# Patient Record
Sex: Male | Born: 1935 | Race: White | Hispanic: No | Marital: Married | Smoking: Never smoker
Health system: Southern US, Community
[De-identification: ages and names within clinical notes are randomized; demographics above are authoritative.]

## PROBLEM LIST (undated history)

## (undated) DIAGNOSIS — F329 Major depressive disorder, single episode, unspecified: Secondary | ICD-10-CM

## (undated) DIAGNOSIS — E119 Type 2 diabetes mellitus without complications: Secondary | ICD-10-CM

## (undated) DIAGNOSIS — N400 Enlarged prostate without lower urinary tract symptoms: Secondary | ICD-10-CM

## (undated) DIAGNOSIS — N184 Chronic kidney disease, stage 4 (severe): Secondary | ICD-10-CM

## (undated) DIAGNOSIS — F32A Depression, unspecified: Secondary | ICD-10-CM

## (undated) DIAGNOSIS — C4440 Unspecified malignant neoplasm of skin of scalp and neck: Secondary | ICD-10-CM

---

## 2006-01-11 ENCOUNTER — Ambulatory Visit: Payer: Self-pay | Admitting: Chiropractic Medicine

## 2009-04-30 ENCOUNTER — Ambulatory Visit: Payer: Self-pay | Admitting: Surgery

## 2017-05-18 DIAGNOSIS — C4440 Unspecified malignant neoplasm of skin of scalp and neck: Secondary | ICD-10-CM

## 2017-05-18 HISTORY — DX: Unspecified malignant neoplasm of skin of scalp and neck: C44.40

## 2018-03-18 ENCOUNTER — Observation Stay
Admission: EM | Admit: 2018-03-18 | Discharge: 2018-03-19 | Disposition: A | Discharge status: Home / Self Care | Payer: Medicare HMO | Attending: Internal Medicine | Admitting: Internal Medicine

## 2018-03-18 ENCOUNTER — Other Ambulatory Visit: Payer: Self-pay

## 2018-03-18 ENCOUNTER — Emergency Department: Payer: Medicare HMO

## 2018-03-18 DIAGNOSIS — N184 Chronic kidney disease, stage 4 (severe): Secondary | ICD-10-CM | POA: Diagnosis present

## 2018-03-18 DIAGNOSIS — F329 Major depressive disorder, single episode, unspecified: Secondary | ICD-10-CM | POA: Insufficient documentation

## 2018-03-18 DIAGNOSIS — E1122 Type 2 diabetes mellitus with diabetic chronic kidney disease: Secondary | ICD-10-CM | POA: Insufficient documentation

## 2018-03-18 DIAGNOSIS — E119 Type 2 diabetes mellitus without complications: Secondary | ICD-10-CM

## 2018-03-18 DIAGNOSIS — Z85828 Personal history of other malignant neoplasm of skin: Secondary | ICD-10-CM | POA: Diagnosis not present

## 2018-03-18 DIAGNOSIS — N3001 Acute cystitis with hematuria: Secondary | ICD-10-CM | POA: Diagnosis present

## 2018-03-18 DIAGNOSIS — N401 Enlarged prostate with lower urinary tract symptoms: Secondary | ICD-10-CM | POA: Insufficient documentation

## 2018-03-18 DIAGNOSIS — T839XXA Unspecified complication of genitourinary prosthetic device, implant and graft, initial encounter: Secondary | ICD-10-CM

## 2018-03-18 DIAGNOSIS — Z8249 Family history of ischemic heart disease and other diseases of the circulatory system: Secondary | ICD-10-CM | POA: Insufficient documentation

## 2018-03-18 DIAGNOSIS — K449 Diaphragmatic hernia without obstruction or gangrene: Secondary | ICD-10-CM | POA: Diagnosis not present

## 2018-03-18 DIAGNOSIS — R338 Other retention of urine: Secondary | ICD-10-CM | POA: Diagnosis present

## 2018-03-18 DIAGNOSIS — Z882 Allergy status to sulfonamides status: Secondary | ICD-10-CM | POA: Diagnosis not present

## 2018-03-18 DIAGNOSIS — Z79899 Other long term (current) drug therapy: Secondary | ICD-10-CM | POA: Insufficient documentation

## 2018-03-18 DIAGNOSIS — Z466 Encounter for fitting and adjustment of urinary device: Secondary | ICD-10-CM | POA: Diagnosis not present

## 2018-03-18 DIAGNOSIS — N289 Disorder of kidney and ureter, unspecified: Secondary | ICD-10-CM | POA: Diagnosis present

## 2018-03-18 DIAGNOSIS — I10 Essential (primary) hypertension: Secondary | ICD-10-CM | POA: Diagnosis present

## 2018-03-18 DIAGNOSIS — Z66 Do not resuscitate: Secondary | ICD-10-CM | POA: Diagnosis not present

## 2018-03-18 DIAGNOSIS — I700 Atherosclerosis of aorta: Secondary | ICD-10-CM | POA: Insufficient documentation

## 2018-03-18 DIAGNOSIS — Z7984 Long term (current) use of oral hypoglycemic drugs: Secondary | ICD-10-CM | POA: Insufficient documentation

## 2018-03-18 DIAGNOSIS — N138 Other obstructive and reflux uropathy: Secondary | ICD-10-CM | POA: Diagnosis present

## 2018-03-18 DIAGNOSIS — K5730 Diverticulosis of large intestine without perforation or abscess without bleeding: Secondary | ICD-10-CM | POA: Insufficient documentation

## 2018-03-18 HISTORY — DX: Type 2 diabetes mellitus without complications: E11.9

## 2018-03-18 HISTORY — DX: Chronic kidney disease, stage 4 (severe): N18.4

## 2018-03-18 HISTORY — DX: Unspecified malignant neoplasm of skin of scalp and neck: C44.40

## 2018-03-18 HISTORY — DX: Major depressive disorder, single episode, unspecified: F32.9

## 2018-03-18 HISTORY — DX: Depression, unspecified: F32.A

## 2018-03-18 HISTORY — DX: Benign prostatic hyperplasia without lower urinary tract symptoms: N40.0

## 2018-03-18 LAB — BASIC METABOLIC PANEL
Anion gap: 10 (ref 5–15)
BUN: 44 mg/dL — AB (ref 8–23)
CHLORIDE: 107 mmol/L (ref 98–111)
CO2: 19 mmol/L — AB (ref 22–32)
CREATININE: 3.03 mg/dL — AB (ref 0.61–1.24)
Calcium: 10 mg/dL (ref 8.9–10.3)
GFR calc Af Amer: 20 mL/min — ABNORMAL LOW (ref >60)
GFR calc non Af Amer: 17 mL/min — ABNORMAL LOW (ref >60)
GLUCOSE: 141 mg/dL — AB (ref 70–99)
POTASSIUM: 4.3 mmol/L (ref 3.5–5.1)
Sodium: 136 mmol/L (ref 135–145)

## 2018-03-18 LAB — CBC WITH DIFFERENTIAL/PLATELET
Abs Immature Granulocytes: 0.03 10*3/uL (ref 0.00–0.07)
BASOS PCT: 1 %
Basophils Absolute: 0.1 10*3/uL (ref 0.0–0.1)
EOS PCT: 3 %
Eosinophils Absolute: 0.3 10*3/uL (ref 0.0–0.5)
HCT: 40.9 % (ref 39.0–52.0)
Hemoglobin: 13.5 g/dL (ref 13.0–17.0)
Immature Granulocytes: 0 %
Lymphocytes Relative: 16 %
Lymphs Abs: 1.3 10*3/uL (ref 0.7–4.0)
MCH: 29.3 pg (ref 26.0–34.0)
MCHC: 33 g/dL (ref 30.0–36.0)
MCV: 88.7 fL (ref 80.0–100.0)
MONO ABS: 0.7 10*3/uL (ref 0.1–1.0)
Monocytes Relative: 9 %
NRBC: 0 % (ref 0.0–0.2)
Neutro Abs: 5.9 10*3/uL (ref 1.7–7.7)
Neutrophils Relative %: 71 %
Platelets: 158 10*3/uL (ref 150–400)
RBC: 4.61 MIL/uL (ref 4.22–5.81)
RDW: 14.2 % (ref 11.5–15.5)
WBC: 8.3 10*3/uL (ref 4.0–10.5)

## 2018-03-18 LAB — URINALYSIS, COMPLETE (UACMP) WITH MICROSCOPIC
BILIRUBIN URINE: NEGATIVE
Glucose, UA: 50 mg/dL — AB
Ketones, ur: NEGATIVE mg/dL
Nitrite: NEGATIVE
PROTEIN: 30 mg/dL — AB
SQUAMOUS EPITHELIAL / LPF: NONE SEEN (ref 0–5)
Specific Gravity, Urine: 1.009 (ref 1.005–1.030)
WBC, UA: >50 WBC/hpf — ABNORMAL HIGH (ref 0–5)
pH: 5 (ref 5.0–8.0)

## 2018-03-18 MED ORDER — SODIUM CHLORIDE 0.9 % IV SOLN
1.0000 g | Freq: Once | INTRAVENOUS | Status: AC
  Administered 2018-03-18: 1 g via INTRAVENOUS
  Filled 2018-03-18: qty 10

## 2018-03-18 MED ORDER — SODIUM CHLORIDE 0.9 % IV BOLUS
1000.0000 mL | Freq: Once | INTRAVENOUS | Status: AC
  Administered 2018-03-18: 1000 mL via INTRAVENOUS

## 2018-03-18 MED ORDER — LIDOCAINE HCL URETHRAL/MUCOSAL 2 % EX GEL
1.0000 "application " | Freq: Once | CUTANEOUS | Status: AC
  Administered 2018-03-18: 1 via URETHRAL
  Filled 2018-03-18: qty 10

## 2018-03-18 NOTE — ED Notes (Signed)
Bladder scanner showed 800 cc urine

## 2018-03-18 NOTE — ED Triage Notes (Signed)
Per [facility], pt has had no urinary output for several hours.  Has foley catheter, placed in [facility] followed by Dr. Joella Prince, urologist.  Foley placed 2 weeks ago.  Hx;  DM.

## 2018-03-18 NOTE — ED Notes (Signed)
#  14 coude catheter discontinued.  Pt tolerated well.  Lidocaine jelly applied.  #16 coude catheter placed.  Return of clear urine noted.  Pt stated he felt relief.

## 2018-03-18 NOTE — ED Provider Notes (Signed)
[facility] Emergency Department Provider Note  ____________________________________________  Time seen: Approximately 9:48 PM  I have reviewed the triage vital signs and the nursing notes.   HISTORY  Chief Complaint Urinary Retention   HPI Steven Gay is a 84 y.o. male with a history of BPH, diabetes, chronic kidney disease who presents for evaluation of urinary retention.  Patient was admitted to [facility] with acute kidney injury and a UTI.  Had a Foley catheter placed.  He reports that throughout the day today he has had no urine output through the catheter.  Is complaining of progressively worsening suprapubic abdominal pain that he describes as pressure and sharp.  Also complaining that the pain radiates to his back.  No fever or chills, no nausea or vomiting.  Patient has a very heavy amount of sediment in the Foley catheter.  He does not know how long these have been there.  PMH BPH Diabetes Chronic kidney disease  Allergies Sulfa antibiotics  FH CAD - father  Social History Alcohol none Drugs none Smoking none  Review of Systems  Constitutional: Negative for fever. Eyes: Negative for visual changes. ENT: Negative for sore throat. Neck: No neck pain  Cardiovascular: Negative for chest pain. Respiratory: Negative for shortness of breath. Gastrointestinal: Negative for abdominal pain, vomiting or diarrhea. Genitourinary: Negative for dysuria. + urinary retention Musculoskeletal: Negative for back pain. Skin: Negative for rash. Neurological: Negative for headaches, weakness or numbness. Psych: No SI or HI  ____________________________________________   PHYSICAL EXAM:  VITAL SIGNS: ED Triage Vitals  Enc Vitals Group     BP 03/18/18 2130 (!) 168/101     Pulse --      Resp --      Temp 03/18/18 2130 98.6 F (37 C)     Temp Source 03/18/18 2130 Oral     SpO2 03/18/18 2130 97 %     Weight 03/18/18 2133 185 lb (83.9 kg)     Height  03/18/18 2133 5\' 8"  (1.727 m)     Head Circumference --      Peak Flow --      Pain Score 03/18/18 2132 2     Pain Loc --      Pain Edu? --      Excl. in [place]? --     Constitutional: Alert and oriented. Well appearing and in no apparent distress. HEENT:      Head: Normocephalic and atraumatic.         Eyes: Conjunctivae are normal. Sclera is non-icteric.       Mouth/Throat: Mucous membranes are moist.       Neck: Supple with no signs of meningismus. Cardiovascular: Regular rate and rhythm. No murmurs, gallops, or rubs. 2+ symmetrical distal pulses are present in all extremities. No JVD. Respiratory: Normal respiratory effort. Lungs are clear to auscultation bilaterally. No wheezes, crackles, or rhonchi.  Gastrointestinal: Soft, mild suprapubic tenderness, and non distended with positive bowel sounds. No rebound or guarding. Musculoskeletal: Nontender with normal range of motion in all extremities. No edema, cyanosis, or erythema of extremities. Neurologic: Normal speech and language. Face is symmetric. Moving all extremities. No gross focal neurologic deficits are appreciated. Skin: Skin is warm, dry and intact. No rash noted. Psychiatric: Mood and affect are normal. Speech and behavior are normal.  ____________________________________________   LABS (all labs ordered are listed, but only abnormal results are displayed)  Labs Reviewed  URINALYSIS, COMPLETE (UACMP) WITH MICROSCOPIC - Abnormal; Notable for the following components:  Result Value   Color, Urine YELLOW (*)    APPearance HAZY (*)    Glucose, UA 50 (*)    Hgb urine dipstick MODERATE (*)    Protein, ur 30 (*)    Leukocytes, UA MODERATE (*)    WBC, UA >50 (*)    Bacteria, UA RARE (*)    All other components within normal limits  BASIC METABOLIC PANEL - Abnormal; Notable for the following components:   CO2 19 (*)    Glucose, Bld 141 (*)    BUN 44 (*)    Creatinine, Ser 3.03 (*)    GFR calc non Af Amer 17 (*)     GFR calc Af Amer 20 (*)    All other components within normal limits  URINE CULTURE  CBC WITH DIFFERENTIAL/PLATELET   ____________________________________________  EKG  none  ____________________________________________  RADIOLOGY  I have personally reviewed the images performed during this visit and I agree with the Radiologist's read.   Interpretation by Radiologist:  Ct Renal Stone Study  Result Date: 03/18/2018 CLINICAL DATA:  84 y/o M; bilateral flank pain and lower pelvic pain. No urinary output for several hours. EXAM: CT ABDOMEN AND PELVIS WITHOUT CONTRAST TECHNIQUE: Multidetector CT imaging of the abdomen and pelvis was performed following the standard protocol without IV contrast. COMPARISON:  None. FINDINGS: Lower chest: Coronary artery calcific atherosclerosis. Small hiatal hernia. Hepatobiliary: No focal liver abnormality is seen. Status post cholecystectomy. No biliary dilatation. Pancreas: Unremarkable. No pancreatic ductal dilatation or surrounding inflammatory changes. Spleen: Normal in size without focal abnormality. Adrenals/Urinary Tract: Normal adrenal glands. Multiple simple and hemorrhagic cysts of the kidneys bilaterally measuring up to 10 cm in the right kidney upper pole. 2.7 x 2.1 cm multi-septated cystic lesion arising from the lower pole of right kidney (series 2, image 50). No hydronephrosis. No urinary stone disease. Bladder is collapsed around Foley catheter. Bladder wall thickening and pericystic fat stranding. Stomach/Bowel: Stomach is within normal limits. Appendix appears normal. No evidence of bowel wall thickening, distention, or inflammatory changes. Sigmoid diverticulosis without findings of acute diverticulitis. Vascular/Lymphatic: Aortic atherosclerosis. No enlarged abdominal or pelvic lymph nodes. Reproductive: Mild prostate hypertrophy. Other: Trace volume of pelvic ascites. Right inguinal hernia repair without appreciable recurrence. Musculoskeletal:  No fracture is seen. Lumbar spondylosis greatest at the L5-S1 level with there is moderate loss of intervertebral disc space height. IMPRESSION: 1. Bladder wall thickening, trace pelvic ascites, and pericystic fat stranding, probably cystitis. 2. 2.7 cm indeterminate multi-septated cystic lesion arising from lower pole of right kidney. Renal protocol CT or MRI is recommended on a nonemergent basis. 3. Sigmoid diverticulosis without findings of acute diverticulitis. 4. Aortic Atherosclerosis (ICD10-I70.0). Coronary artery calcific atherosclerosis. 5. Small hiatal hernia. Electronically Signed   By: Kristine Garbe M.D.   On: 03/18/2018 22:42     ____________________________________________   PROCEDURES  Procedure(s) performed: None Procedures Critical Care performed:  None ____________________________________________   INITIAL IMPRESSION / ASSESSMENT AND PLAN / ED COURSE  84 y.o. male with a history of BPH, diabetes, chronic kidney disease who presents for evaluation of urinary retention.  Urine is very cloudy on Foley catheter.  The patient has had no fever and has a normal white count with no signs of sepsis. Culture pending. Rocephin given.  Bladder scan showing 800 cc.  Foley catheter was exchanged.  Labs concerning for Acute kidney injury with a creatinine of 3.03 most likely due to bladder obstruction.  After Foley catheter patient has had 800 cc of urine  output.  Patient will receive a liter of fluids.  CT renal was done due to complaints of back pain to rule out kidney stone which is negative.  Patient be admitted to the hospitalist service per      As part of my medical decision making, I reviewed the following data within the [place] notes reviewed and incorporated, Labs reviewed , Old chart reviewed, Radiograph reviewed , Discussed with admitting physician , Notes from prior ED visits and [place] Controlled Substance Database    Pertinent labs &  imaging results that were available during my care of the patient were reviewed by me and considered in my medical decision making (see chart for details).    ____________________________________________   FINAL CLINICAL IMPRESSION(S) / ED DIAGNOSES  Final diagnoses:  Acute urinary retention  Problem with Foley catheter, initial encounter [facility])  Renal insufficiency  Acute cystitis with hematuria      NEW MEDICATIONS STARTED DURING THIS VISIT:  ED Discharge Orders    None       Note:  This document was prepared using Dragon voice recognition software and may include unintentional dictation errors.    Rudene Re, MD 03/18/18 [phone]

## 2018-03-19 ENCOUNTER — Other Ambulatory Visit: Payer: Self-pay

## 2018-03-19 ENCOUNTER — Encounter: Payer: Self-pay | Admitting: Internal Medicine

## 2018-03-19 DIAGNOSIS — N184 Chronic kidney disease, stage 4 (severe): Secondary | ICD-10-CM | POA: Diagnosis present

## 2018-03-19 LAB — GLUCOSE, CAPILLARY
GLUCOSE-CAPILLARY: 125 mg/dL — AB (ref 70–99)
Glucose-Capillary: 107 mg/dL — ABNORMAL HIGH (ref 70–99)

## 2018-03-19 LAB — BASIC METABOLIC PANEL
Anion gap: 9 (ref 5–15)
BUN: 39 mg/dL — ABNORMAL HIGH (ref 8–23)
CHLORIDE: 112 mmol/L — AB (ref 98–111)
CO2: 19 mmol/L — AB (ref 22–32)
Calcium: 9.2 mg/dL (ref 8.9–10.3)
Creatinine, Ser: 2.84 mg/dL — ABNORMAL HIGH (ref 0.61–1.24)
GFR calc Af Amer: 21 mL/min — ABNORMAL LOW (ref >60)
GFR calc non Af Amer: 18 mL/min — ABNORMAL LOW (ref >60)
Glucose, Bld: 125 mg/dL — ABNORMAL HIGH (ref 70–99)
Potassium: 4 mmol/L (ref 3.5–5.1)
SODIUM: 140 mmol/L (ref 135–145)

## 2018-03-19 LAB — CBC
HEMATOCRIT: 35.9 % — AB (ref 39.0–52.0)
Hemoglobin: 11.4 g/dL — ABNORMAL LOW (ref 13.0–17.0)
MCH: 28.8 pg (ref 26.0–34.0)
MCHC: 31.8 g/dL (ref 30.0–36.0)
MCV: 90.7 fL (ref 80.0–100.0)
NRBC: 0 % (ref 0.0–0.2)
Platelets: 145 10*3/uL — ABNORMAL LOW (ref 150–400)
RBC: 3.96 MIL/uL — ABNORMAL LOW (ref 4.22–5.81)
RDW: 14.1 % (ref 11.5–15.5)
WBC: 5.9 10*3/uL (ref 4.0–10.5)

## 2018-03-19 MED ORDER — FINASTERIDE 5 MG PO TABS
5.0000 mg | ORAL_TABLET | Freq: Every day | ORAL | Status: DC
  Administered 2018-03-19: 5 mg via ORAL
  Filled 2018-03-19: qty 1

## 2018-03-19 MED ORDER — INSULIN ASPART 100 UNIT/ML ~~LOC~~ SOLN
0.0000 [IU] | Freq: Three times a day (TID) | SUBCUTANEOUS | Status: DC
  Administered 2018-03-19: 1 [IU] via SUBCUTANEOUS
  Filled 2018-03-19: qty 1

## 2018-03-19 MED ORDER — CIPROFLOXACIN HCL 500 MG PO TABS
500.0000 mg | ORAL_TABLET | ORAL | Status: DC
  Administered 2018-03-19: 500 mg via ORAL
  Filled 2018-03-19: qty 1

## 2018-03-19 MED ORDER — ACETAMINOPHEN 325 MG PO TABS: 650.0000 mg | ORAL_TABLET | Freq: Four times a day (QID) | ORAL | Status: DC | PRN

## 2018-03-19 MED ORDER — TAMSULOSIN HCL 0.4 MG PO CAPS
0.4000 mg | ORAL_CAPSULE | Freq: Every day | ORAL | Status: DC
  Administered 2018-03-19: 0.4 mg via ORAL
  Filled 2018-03-19: qty 1

## 2018-03-19 MED ORDER — ONDANSETRON HCL 4 MG PO TABS: 4.0000 mg | ORAL_TABLET | Freq: Four times a day (QID) | ORAL | Status: DC | PRN

## 2018-03-19 MED ORDER — ACETAMINOPHEN 650 MG RE SUPP: 650.0000 mg | Freq: Four times a day (QID) | RECTAL | Status: DC | PRN

## 2018-03-19 MED ORDER — HEPARIN SODIUM (PORCINE) 5000 UNIT/ML IJ SOLN
5000.0000 [IU] | Freq: Three times a day (TID) | INTRAMUSCULAR | Status: DC
  Administered 2018-03-19: 5000 [IU] via SUBCUTANEOUS
  Filled 2018-03-19: qty 1

## 2018-03-19 MED ORDER — ONDANSETRON HCL 4 MG/2ML IJ SOLN: 4.0000 mg | Freq: Four times a day (QID) | INTRAMUSCULAR | Status: DC | PRN

## 2018-03-19 NOTE — Care Management Obs Status (Signed)
[place] NOTIFICATION   Patient Details  Name: Steven Gay MRN: [id number] Date of Birth: February 01, 1928   Medicare Observation Status Notification Given:  Yes    Niasia Lanphear A Karyss Frese, RN 03/19/2018, 1:48 PM

## 2018-03-19 NOTE — H&P (Signed)
[place] [facility] [place] NAME: Steven Gay    MR#:  [id number]  DATE OF BIRTH:  12/16/1927  DATE OF ADMISSION:  03/18/2018  PRIMARY CARE PHYSICIAN: System, Pcp Not In   REQUESTING/REFERRING PHYSICIAN: Rudene Re, MD  CHIEF COMPLAINT:   Chief Complaint  Patient presents with  . Urinary Retention    HISTORY OF PRESENT ILLNESS:  Steven Gay  is a 84 y.o. male who presents with chief complaint as above.  Patient had a chronic foley recently placed by urology at [facility] 2 weeks ago to assist with urinary retention.  Patient reports being unable to urinate for the "whole day", with coinciding 8/10 suprapubic and lower back pain.  Patient denies fever/chills, and denies seeing any clots/thick drainage in his catheter prior to the obstruction.  ED replaced the coude catheter which relieved the obstruction, Patient reporting only minor lower back soreness greater on the left.  Patient's Cr elevated from baseline, urinalysis indeterminate for possible UTI, urine culture sent and one dose of antibiotics given.  Hospitalist called for admission.  PAST MEDICAL HISTORY:   Past Medical History:  Diagnosis Date  . BPH (benign prostatic hyperplasia)   . CKD (chronic kidney disease), stage IV ([place])   . Depression   . Diabetes mellitus ([place])   . Skin cancer of scalp 05/2017    PAST SURGICAL HISTORY:  No past surgeries.  SOCIAL HISTORY:   Social History   Tobacco Use  . Smoking status: Never  Substance Use Topics  . Alcohol use: Never    FAMILY HISTORY:  Mother- deceased, CAD Father- deceased, CAD Brother- alive, Diabetes.  DRUG ALLERGIES:  No Known Allergies  MEDICATIONS AT HOME:   Prior to Admission medications   Medication Sig Start Date End Date Taking? Authorizing Provider  buPROPion (WELLBUTRIN SR) 150 MG 12 hr tablet Take 150 mg by mouth 2 (two) times daily. 03/11/18 03/11/19 Yes [provider]   ciprofloxacin (CIPRO) 500 MG tablet Take 500 mg by mouth 2 (two) times daily. 03/14/18 03/21/18 Yes [provider]  finasteride (PROSCAR) 5 MG tablet Take 5 mg by mouth daily. 01/19/18 01/19/19 Yes [provider]  metFORMIN (GLUCOPHAGE) 500 MG tablet Take 500 mg by mouth 2 (two) times daily with a meal.   Yes [provider]  tamsulosin (FLOMAX) 0.4 MG CAPS capsule Take 0.4 mg by mouth daily.   Yes [provider]    REVIEW OF SYSTEMS:  Review of Systems  Constitutional: Negative for chills, fever, malaise/fatigue and weight loss.  HENT: Positive for hearing loss. Negative for ear pain and tinnitus.        Patient wearing bilateral hearing aides.  Eyes: Negative for blurred vision, double vision, pain and redness.  Respiratory: Negative for cough, hemoptysis and shortness of breath.   Cardiovascular: Positive for leg swelling. Negative for chest pain, palpitations and orthopnea.       States legs "feel tight"  Gastrointestinal: Negative for abdominal pain, constipation, diarrhea, nausea and vomiting.  Genitourinary: Positive for flank pain. Negative for dysuria, frequency and hematuria.       Positive suprapubic pain.  Musculoskeletal: Negative for back pain, joint pain and neck pain.  Skin:       No acne, rash, or lesions  Neurological: Negative for dizziness, tremors, focal weakness and weakness.  Endo/Heme/Allergies: Negative for polydipsia. Does not bruise/bleed easily.  Psychiatric/Behavioral: Negative for depression. The patient is not nervous/anxious and does not have insomnia.  VITAL SIGNS:   Vitals:   03/18/18 2130 03/18/18 2133  BP: (!) 168/101   Temp: 98.6 F (37 C)   TempSrc: Oral   SpO2: 97%   Weight:  83.9 kg  Height:  5\' 8"  (1.727 m)   Wt Readings from Last 3 Encounters:  03/18/18 83.9 kg    PHYSICAL EXAMINATION:  Physical Exam  Vitals reviewed. Constitutional: He is oriented to person, place, and time. He appears  well-developed and well-nourished. No distress.  HENT:  Head: Normocephalic and atraumatic.  Mouth/Throat: Oropharynx is clear and moist.  Eyes: Pupils are equal, round, and reactive to light. Conjunctivae and EOM are normal. No scleral icterus.  Neck: Normal range of motion. Neck supple. No JVD present. No thyromegaly present.  Cardiovascular: Normal rate, regular rhythm and intact distal pulses. Exam reveals no gallop and no friction rub.  No murmur heard. Respiratory: Effort normal and breath sounds normal. No respiratory distress. He has no wheezes. He has no rales.  GI: Soft. Bowel sounds are normal. He exhibits no distension. There is no tenderness.  Genitourinary: Penis normal.  Genitourinary Comments: Lower flank tenderness, greater on L  Musculoskeletal: Normal range of motion. He exhibits no edema.  No arthritis, no gout  Lymphadenopathy:    He has no cervical adenopathy.  Neurological: He is alert and oriented to person, place, and time. No cranial nerve deficit.  No dysarthria, no aphasia  Skin: Skin is warm and dry. No rash noted. No erythema.  Psychiatric: He has a normal mood and affect. His behavior is normal. Judgment and thought content normal.    LABORATORY PANEL:   CBC Recent Labs  Lab 03/18/18 2134  WBC 8.3  HGB 13.5  HCT 40.9  PLT 158   ------------------------------------------------------------------------------------------------------------------  Chemistries  Recent Labs  Lab 03/18/18 2134  NA 136  K 4.3  CL 107  CO2 19*  GLUCOSE 141*  BUN 44*  CREATININE 3.03*  CALCIUM 10.0   ------------------------------------------------------------------------------------------------------------------  Cardiac Enzymes No results for input(s): TROPONINI in the last 168 hours. ------------------------------------------------------------------------------------------------------------------  RADIOLOGY:  Ct Renal Stone Study  Result Date:  03/18/2018 CLINICAL DATA:  84 y/o M; bilateral flank pain and lower pelvic pain. No urinary output for several hours. EXAM: CT ABDOMEN AND PELVIS WITHOUT CONTRAST TECHNIQUE: Multidetector CT imaging of the abdomen and pelvis was performed following the standard protocol without IV contrast. COMPARISON:  None. FINDINGS: Lower chest: Coronary artery calcific atherosclerosis. Small hiatal hernia. Hepatobiliary: No focal liver abnormality is seen. Status post cholecystectomy. No biliary dilatation. Pancreas: Unremarkable. No pancreatic ductal dilatation or surrounding inflammatory changes. Spleen: Normal in size without focal abnormality. Adrenals/Urinary Tract: Normal adrenal glands. Multiple simple and hemorrhagic cysts of the kidneys bilaterally measuring up to 10 cm in the right kidney upper pole. 2.7 x 2.1 cm multi-septated cystic lesion arising from the lower pole of right kidney (series 2, image 50). No hydronephrosis. No urinary stone disease. Bladder is collapsed around Foley catheter. Bladder wall thickening and pericystic fat stranding. Stomach/Bowel: Stomach is within normal limits. Appendix appears normal. No evidence of bowel wall thickening, distention, or inflammatory changes. Sigmoid diverticulosis without findings of acute diverticulitis. Vascular/Lymphatic: Aortic atherosclerosis. No enlarged abdominal or pelvic lymph nodes. Reproductive: Mild prostate hypertrophy. Other: Trace volume of pelvic ascites. Right inguinal hernia repair without appreciable recurrence. Musculoskeletal: No fracture is seen. Lumbar spondylosis greatest at the L5-S1 level with there is moderate loss of intervertebral disc space height. IMPRESSION: 1. Bladder wall thickening, trace pelvic ascites, and pericystic fat  stranding, probably cystitis. 2. 2.7 cm indeterminate multi-septated cystic lesion arising from lower pole of right kidney. Renal protocol CT or MRI is recommended on a nonemergent basis. 3. Sigmoid diverticulosis  without findings of acute diverticulitis. 4. Aortic Atherosclerosis (ICD10-I70.0). Coronary artery calcific atherosclerosis. 5. Small hiatal hernia. Electronically Signed   By: Kristine Garbe M.D.   On: 03/18/2018 22:42    EKG:  No orders found for this or any previous visit.  IMPRESSION AND PLAN:  Principal Problem:   BPH with urinary obstruction: Urinalysis unclear if infectious process is present, one dose of Rocephin given in ED and Urine Culture obtained, will continue to monitor vitals and lab work for signs of infection.  Urology consult placed.    Active Problems:   Diabetes [facility]): Sliding Scale Sensitive Coverage ordered AC, HS.     Acute on CKD (chronic kidney disease), stage IV ([place]): Avoid nephrotoxins, likely related obstruction in possible setting of infection.  One dose of antibiotics given in ED, will monitor for improvement- see plan above   Chart review performed and case discussed with ED provider. Labs, imaging and/or ECG reviewed by provider and discussed with patient/family. Management plans discussed with the patient and/or family.  DVT PROPHYLAXIS: SubQ heparin  GI PROPHYLAXIS:  None  ADMISSION STATUS: Observation  CODE STATUS: DNR Advance Directive Documentation     Most Recent Value  Type of Advance Directive  Healthcare Power of [place]  Pre-existing out of facility DNR order (yellow form or pink MOST form)  -  "MOST" Form in Place?  -      TOTAL TIME TAKING CARE OF THIS PATIENT: 40 minutes.   Christ Fullenwider [place] 03/19/2018, 1:03 AM  [employer]  [phone]  CC: Primary care physician; System, Pcp Not In  Note:  This document was prepared using Dragon voice recognition software and may include unintentional dictation errors.

## 2018-03-19 NOTE — Progress Notes (Signed)
PHARMACY NOTE:  ANTIMICROBIAL RENAL DOSAGE ADJUSTMENT  Current antimicrobial regimen includes a mismatch between antimicrobial dosage and estimated renal function.  As per policy approved by the Pharmacy & Therapeutics and Medical Executive Committees, the antimicrobial dosage will be adjusted accordingly.  Current antimicrobial dosage:  Ciprofloxacin 500mg  every 12 hours.   Indication: urinary obstruction/UTI   Renal Function:  Estimated Creatinine Clearance: 19 mL/min (A) (by C-G formula based on SCr of 2.84 mg/dL (H)).     Antimicrobial dosage has been changed to:  Ciprofloxacin 500mg  every 24 hours.    Thank you for allowing pharmacy to be a part of this patient's care.  Pernell Dupre, PharmD, BCPS Clinical Pharmacist 03/19/2018 1:27 PM

## 2018-03-19 NOTE — Care Management Note (Signed)
Case Management Note  Patient Details  Name: ADVITH MARTINE MRN: [id number] Date of Birth: 1927/11/30  Subjective/Objective:   Patient to be discharged per MD order. Orders in place for home health services. Patient and family unsure about agency that patient is currently open to. Based off medical records it would appear [facility] is following and family seems to think that is likely since the patient receives most of his care through [facility]. RNCM has only one contact number for [facility] but it goes to an automated message system. I have left a confidential voicemail to give [facility] a heads up that patient is returning home with services. Fortunately, the patient is observation so there is no need for new orders but none the less I personally attempted to reach [facility] without success at this time. Family also agrees to contact their PCP if there are issues with HH. No DME needs. RNCM to sign off.                   Action/Plan:   Expected Discharge Date:  03/19/18               Expected Discharge Plan:  [place]  In-House Referral:     Discharge planning Services     Post Acute Care Choice:  Resumption of Svcs/PTA Provider Choice offered to:  Patient  DME Arranged:    DME Agency:     HH Arranged:  RN, PT [place] Agency:  [place]  Status of Service:  Completed, signed off  If discussed at [place] of Stay Meetings, dates discussed:    Additional Comments:  Latanya Maudlin, RN 03/19/2018, 2:44 PM

## 2018-03-19 NOTE — Progress Notes (Signed)
Discharge instructions reviewed with patient including followup visits and medication changes.  Understanding was verbalized and all questions were answered.  IV removed without complication; patient tolerated well.  Foley care reviewed in detail with patient; transitioned to leg bag.  Patient discharged home ambulatory in stable condition escorted by volunteer staff.

## 2018-03-19 NOTE — Discharge Summary (Signed)
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 BPH (benign prostatic hyperplasia)   . CKD (chronic kidney disease), stage IV ([place])   . Depression   . Diabetes mellitus ([place])   . Skin cancer of scalp 05/2017    HOSPITAL COURSE:  Steven Gay is a 83 y.o. male with a history of BPH, diabetes, chronic kidney disease who presents for evaluation of urinary retention.  Patient was admitted to [facility] with acute kidney injury and a UTI.  Had a Foley catheter placed.  He reports that throughout the day today he has had no urine output through the catheter.  Is complaining of progressively worsening suprapubic abdominal pain that he describes as pressure and sharp.    * BPH with urinary obstruction:  -patient had replaced a Foley catheter in the ER. Urine output was 2200 mL. Raining well now. No abdominal pain. -Continue and finish of course of Cipro that was started on by [facility] -no fever. Patient feels back to baseline.   * Diabetes [facility]): Sliding Scale Sensitive Coverage ordered AC, HS. -I will hold off on patients metformin given elevated creatinine. Sugars are in the 90s     *Acute on CKD (chronic kidney disease), stage IV ([place]): Avoid nephrotoxins, likely related obstruction in possible setting of infection.   -baseline creatinine  2.5--2.6 -came in with creatinine of 3.03----Foley catheter placed--- 2.82  Patient's home health services will be resume. Overall appears at baseline. He will be discharged to home. Discussed with patient's daughter faye  long  CONSULTS OBTAINED:  Treatment Team:  Irine Seal, MD  DRUG ALLERGIES:  No Known Allergies  DISCHARGE MEDICATIONS:   Allergies as of 03/19/2018   No Known Allergies     Medication List    STOP taking these medications   metFORMIN 500 MG tablet Commonly known as:  GLUCOPHAGE     TAKE these medications   buPROPion 150 MG 12 hr tablet Commonly known as:  WELLBUTRIN SR Take 150 mg by mouth 2 (two) times daily.   ciprofloxacin 500 MG tablet Commonly known as:  CIPRO Take 500 mg by mouth 2 (two) times daily.   finasteride 5 MG tablet Commonly known as:  PROSCAR Take 5 mg by mouth daily.   tamsulosin 0.4 MG Caps capsule Commonly known as:  FLOMAX Take 0.4 mg by mouth daily.       If you experience worsening of your admission symptoms, develop shortness of breath, life threatening emergency, suicidal or homicidal thoughts you must seek medical attention immediately by calling 911 or calling your MD immediately  if symptoms less severe.  You Must read complete instructions/literature along with all the possible adverse reactions/side effects for all the Medicines you take and that have been prescribed to you. Take any new Medicines after you have completely understood and accept all the possible adverse reactions/side effects.   Please note  You were cared for by a hospitalist during your hospital stay. If you have any questions about your discharge medications or the care you received while you were in the hospital after you are discharged, you can call the unit and asked to speak with the hospitalist on call if the hospitalist that took care of you is not available. Once you are discharged, your primary care physician will handle any further medical  issues. Please note that NO REFILLS for any discharge medications will be authorized once you are discharged, as it is imperative that you return to your primary care physician (or establish a relationship with a primary care physician if you do not have one) for your aftercare needs so that they can reassess your need for medications and monitor your lab values. Today   SUBJECTIVE   No new complaints. No abdominal pain. ate good breakfast and lunch  VITAL SIGNS:  Blood pressure (!) 120/52, pulse (!) 55, temperature 98.3 F (36.8 C), temperature source Oral, resp. rate 20, height 5\' 8"  (1.727 m), weight 83.9 kg, SpO2 95 %.  I/O:    Intake/Output Summary (Last 24 hours) at 03/19/2018 1355 Last data filed at 03/19/2018 1224 Gross per 24 hour  Intake 480 ml  Output 2950 ml  Net -2470 ml    PHYSICAL EXAMINATION:  GENERAL:  84 y.o.-year-old patient lying in the bed with no acute distress.  EYES: Pupils equal, round, reactive to light and accommodation. No scleral icterus. Extraocular muscles intact.  HEENT: Head atraumatic, normocephalic. Oropharynx and nasopharynx clear.  NECK:  Supple, no jugular venous distention. No thyroid enlargement, no tenderness.  LUNGS: Normal breath sounds bilaterally, no wheezing, rales,rhonchi or crepitation. No use of accessory muscles of respiration.  CARDIOVASCULAR: S1, S2 normal. No murmurs, rubs, or gallops.  ABDOMEN: Soft, non-tender, non-distended. Bowel sounds present. No organomegaly or mass. Chronic foley EXTREMITIES: No pedal edema, cyanosis, or clubbing.  NEUROLOGIC: Cranial nerves II through XII are intact. Muscle strength 5/5 in all extremities. Sensation intact. Gait not checked.  PSYCHIATRIC: The patient is alert and oriented x 3.  SKIN: No obvious rash, lesion, or ulcer.   DATA REVIEW:   CBC  Recent Labs  Lab 03/19/18 0405  WBC 5.9  HGB 11.4*  HCT 35.9*  PLT 145*    Chemistries  Recent Labs  Lab 03/19/18 0405  NA 140  K 4.0   CL 112*  CO2 19*  GLUCOSE 125*  BUN 39*  CREATININE 2.84*  CALCIUM 9.2    Microbiology Results   No results found for this or any previous visit (from the past 240 hour(s)).  RADIOLOGY:  Ct Renal Stone Study  Result Date: 03/18/2018 CLINICAL DATA:  84 y/o M; bilateral flank pain and lower pelvic pain. No urinary output for several hours. EXAM: CT ABDOMEN AND PELVIS WITHOUT CONTRAST TECHNIQUE: Multidetector CT imaging of the abdomen and pelvis was performed following the standard protocol without IV contrast. COMPARISON:  None. FINDINGS: Lower chest: Coronary artery calcific atherosclerosis. Small hiatal hernia. Hepatobiliary: No focal liver abnormality is seen. Status post cholecystectomy. No biliary dilatation. Pancreas: Unremarkable. No pancreatic ductal dilatation or surrounding inflammatory changes. Spleen: Normal in size without focal abnormality. Adrenals/Urinary Tract: Normal adrenal glands. Multiple simple and hemorrhagic cysts of the kidneys bilaterally measuring up to 10 cm in the right kidney upper pole. 2.7 x 2.1 cm multi-septated cystic lesion arising from the lower pole of right kidney (series 2, image 50). No hydronephrosis. No urinary stone disease. Bladder is collapsed around  Foley catheter. Bladder wall thickening and pericystic fat stranding. Stomach/Bowel: Stomach is within normal limits. Appendix appears normal. No evidence of bowel wall thickening, distention, or inflammatory changes. Sigmoid diverticulosis without findings of acute diverticulitis. Vascular/Lymphatic: Aortic atherosclerosis. No enlarged abdominal or pelvic lymph nodes. Reproductive: Mild prostate hypertrophy. Other: Trace volume of pelvic ascites. Right inguinal hernia repair without appreciable recurrence. Musculoskeletal: No fracture is seen. Lumbar spondylosis greatest at the L5-S1 level with there is moderate loss of intervertebral disc space height. IMPRESSION: 1. Bladder wall thickening, trace pelvic  ascites, and pericystic fat stranding, probably cystitis. 2. 2.7 cm indeterminate multi-septated cystic lesion arising from lower pole of right kidney. Renal protocol CT or MRI is recommended on a nonemergent basis. 3. Sigmoid diverticulosis without findings of acute diverticulitis. 4. Aortic Atherosclerosis (ICD10-I70.0). Coronary artery calcific atherosclerosis. 5. Small hiatal hernia. Electronically Signed   By: Kristine Garbe M.D.   On: 03/18/2018 22:42     Management plans discussed with the patient, family and they are in agreement.  CODE STATUS:     Code Status Orders  (From admission, onward)         Start     Ordered   03/19/18 0252  Do not attempt resuscitation (DNR)  Continuous    Question Answer Comment  In the event of cardiac or respiratory ARREST Do not call a "code blue"   In the event of cardiac or respiratory ARREST Do not perform Intubation, CPR, defibrillation or ACLS   In the event of cardiac or respiratory ARREST Use medication by any route, position, wound care, and other measures to relive pain and suffering. May use oxygen, suction and manual treatment of airway obstruction as needed for comfort.      03/19/18 0251        Code Status History    This patient has a current code status but no historical code status.    Advance Directive Documentation     Most Recent Value  Type of Advance Directive  Healthcare Power of Attorney, Living will  Pre-existing out of facility DNR order (yellow form or pink MOST form)  -  "MOST" Form in Place?  -      TOTAL TIME TAKING CARE OF THIS PATIENT: 40 minutes.    Fritzi Mandes M.D on 03/19/2018 at 1:55 PM  Between 7am to 6pm - Pager - [phone] After 6pm go to [email or web address] - password [email or web address]  Office  [phone]  CC: Primary care physician; System, Pcp Not In

## 2018-03-19 NOTE — Discharge Instructions (Addendum)
Foley care per instruction  To follow up with [facility] urology next Friday on your scheduled appointment

## 2018-03-20 LAB — URINE CULTURE: Culture: 10000 — AB

## 2019-07-30 IMAGING — CT CT RENAL STONE PROTOCOL
2 of 4 series · 16 of 46 positions shown, 18 images · non-contrast
Comparison: None.

CLINICAL DATA: 89 y/o M; bilateral flank pain and lower pelvic
pain. No urinary output for several hours.

EXAM:
CT ABDOMEN AND PELVIS WITHOUT CONTRAST
TECHNIQUE: Multidetector CT imaging of the abdomen and pelvis was performed
following the standard protocol without IV contrast.

[Series 2: stone full standard · axial · 0.77mm/px · z∈[-951,-491]mm · 13 of 102 slices shown, 15 images]
[im 5/102  soft-tissue]
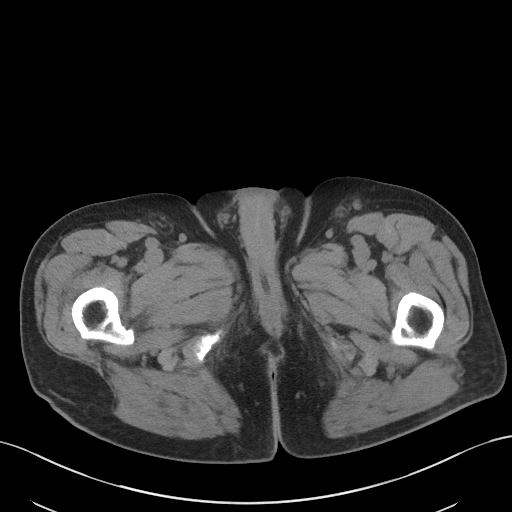
[im 5/102  bone]
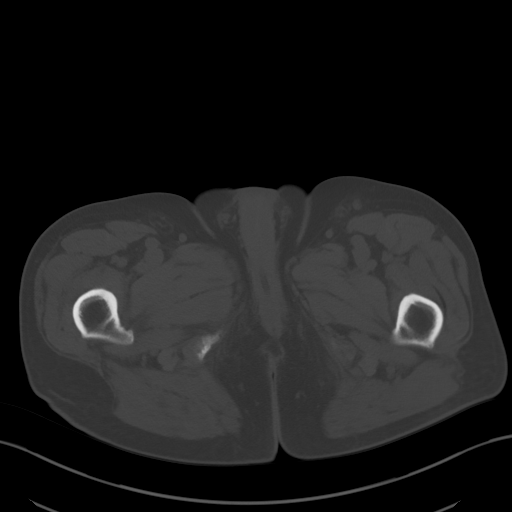
[im 13/102  soft-tissue]
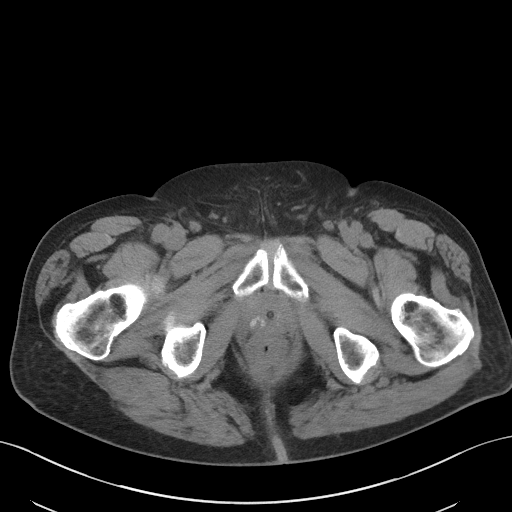
[im 22/102  soft-tissue]
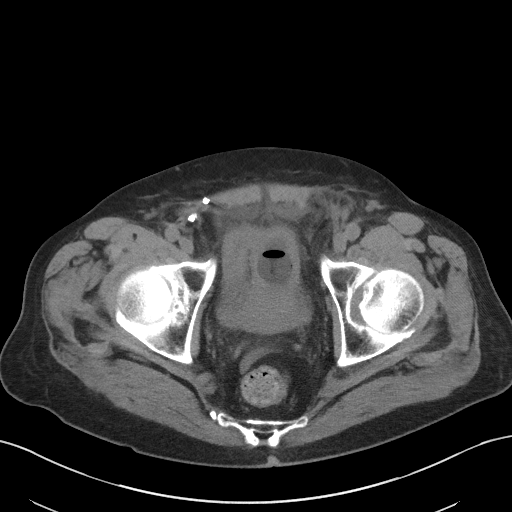
[im 30/102  soft-tissue]
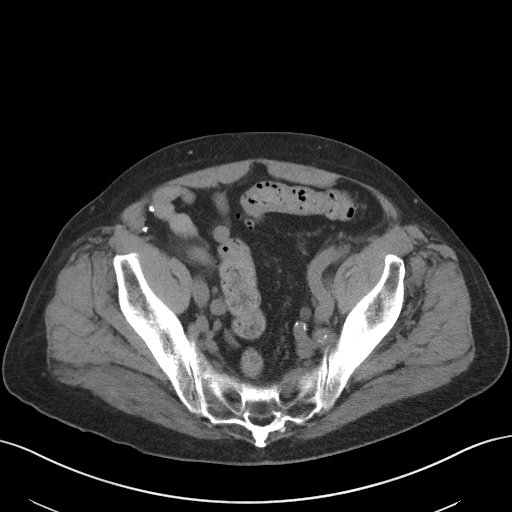
[im 34/102  soft-tissue]
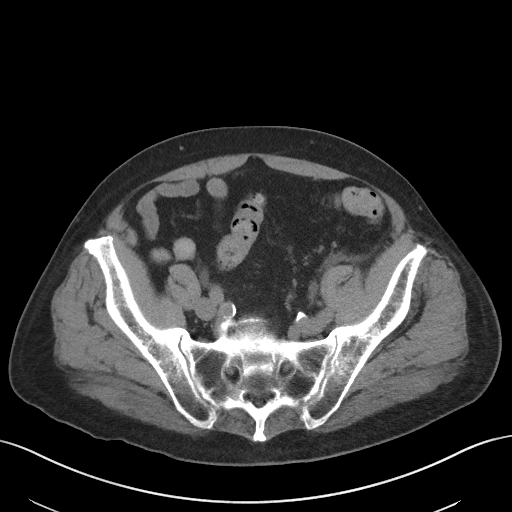
[im 43/102  soft-tissue]
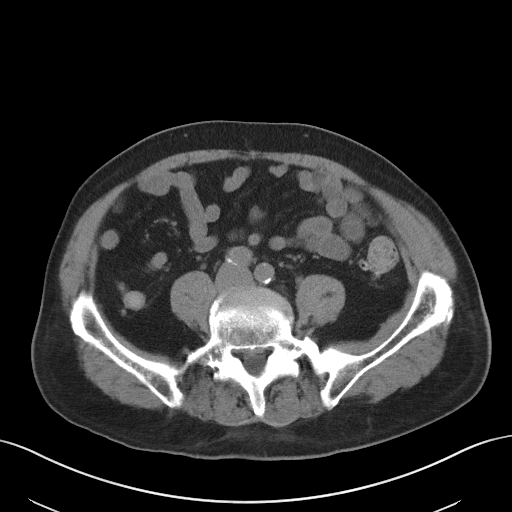
[im 51/102  soft-tissue]
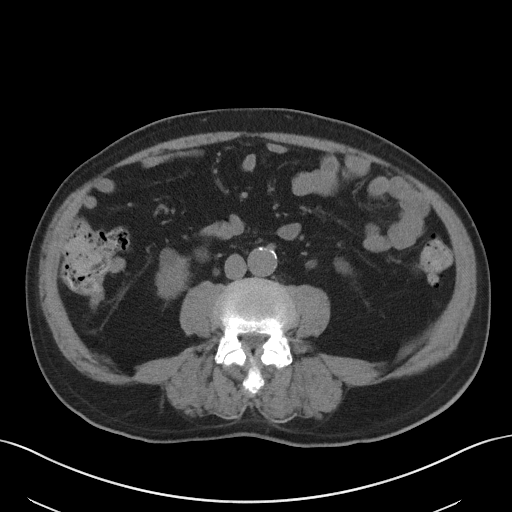
[im 59/102  soft-tissue]
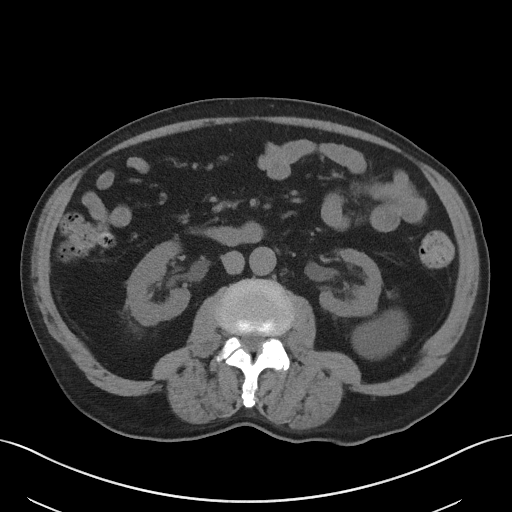
[im 68/102  soft-tissue]
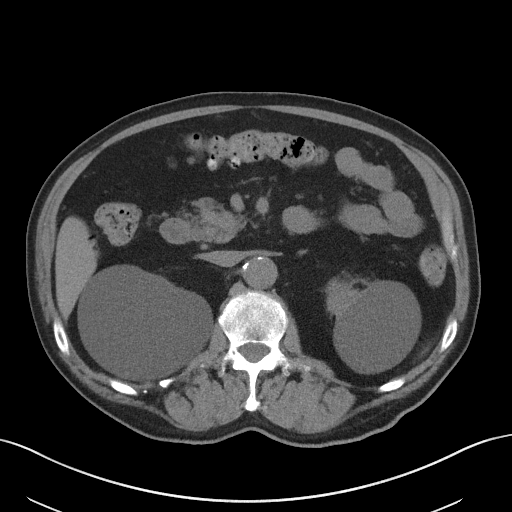
[im 68/102  bone]
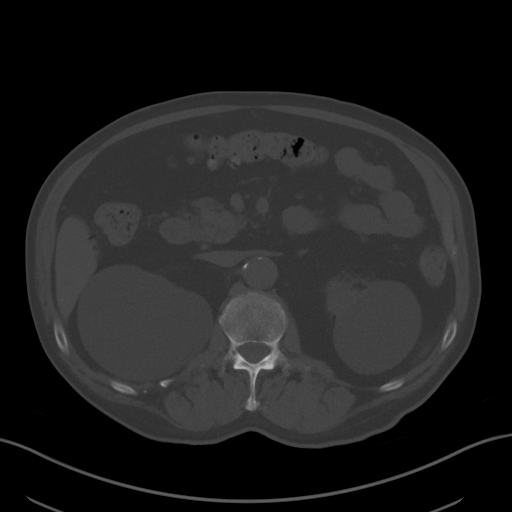
[im 72/102  soft-tissue]
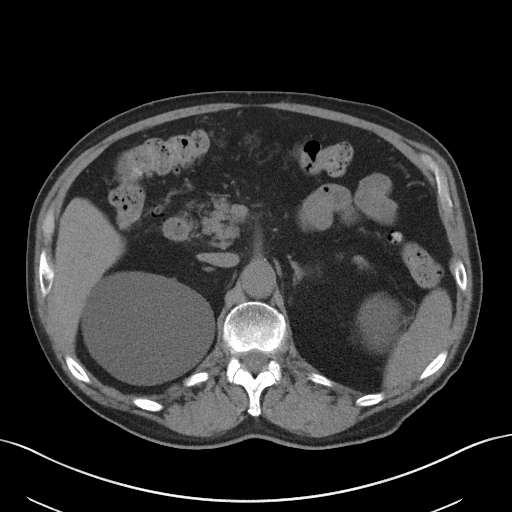
[im 80/102  soft-tissue]
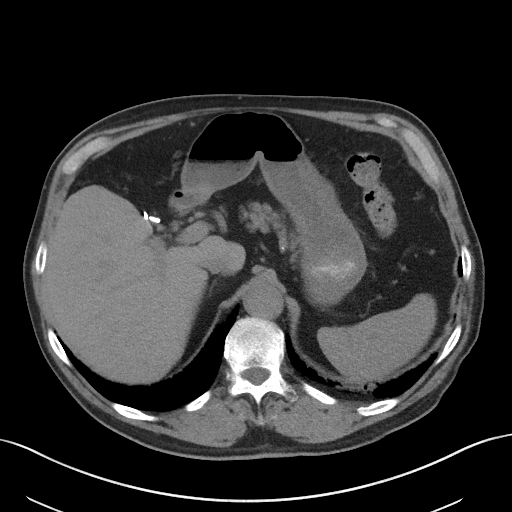
[im 89/102  soft-tissue]
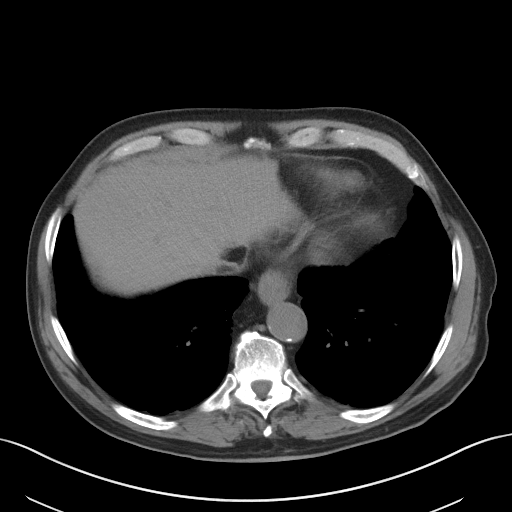
[im 97/102  soft-tissue]
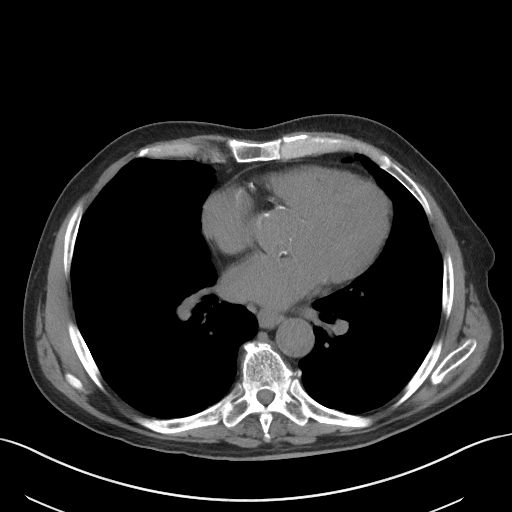

[Series 5: coronal · coronal · 0.79mm/px · 3 of 129 slices shown]
[im 43/129  soft-tissue]
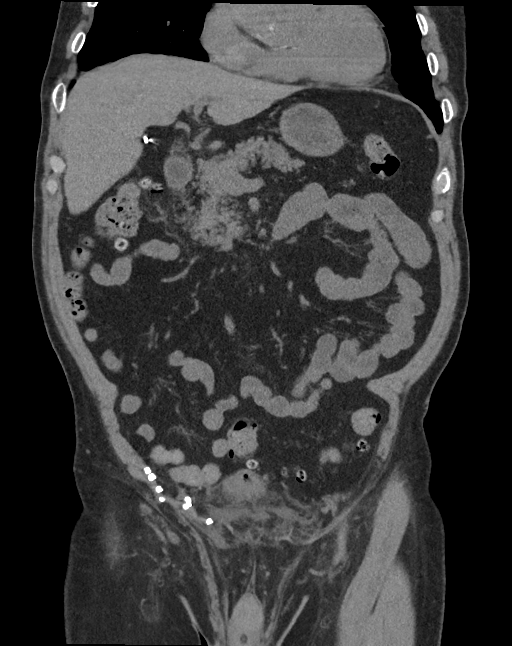
[im 57/129  soft-tissue]
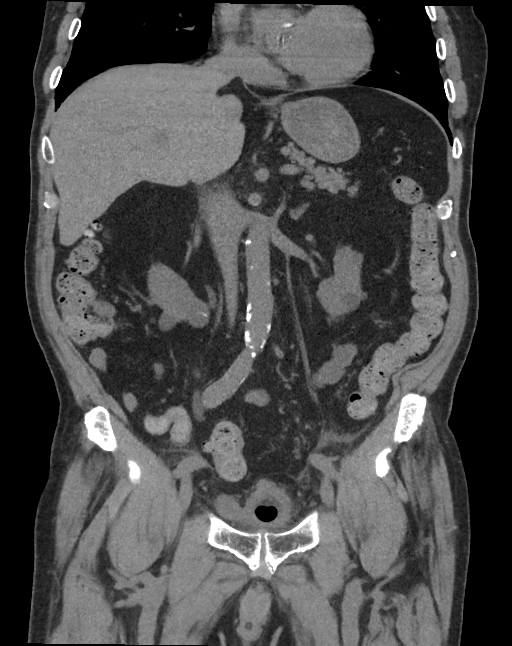
[im 72/129  soft-tissue]
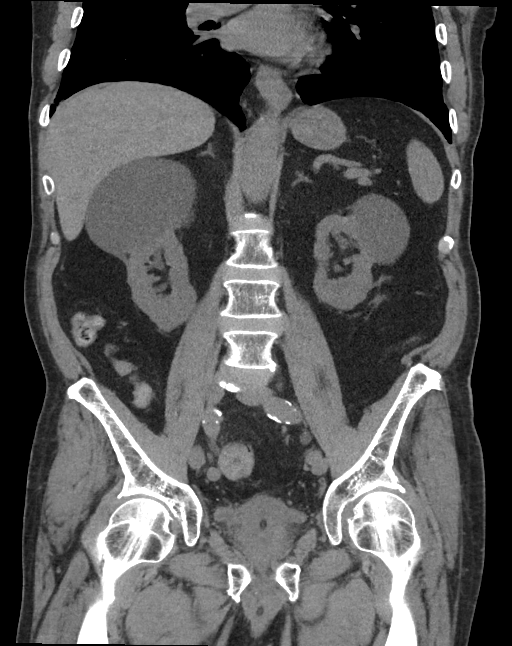

[16 of 46 positions shown; findings below may reference images not displayed]

FINDINGS: Lower chest: Coronary artery calcific atherosclerosis. Small hiatal
hernia.

Hepatobiliary: No focal liver abnormality is seen. Status post
cholecystectomy. No biliary dilatation.

Pancreas: Unremarkable. No pancreatic ductal dilatation or
surrounding inflammatory changes.

Spleen: Normal in size without focal abnormality.

Adrenals/Urinary Tract: Normal adrenal glands. Multiple simple and
hemorrhagic cysts of the kidneys bilaterally measuring up to 10 cm
in the right kidney upper pole. 2.7 x 2.1 cm multi-septated cystic
lesion arising from the lower pole of right kidney (series 2, image
50). No hydronephrosis. No urinary stone disease. Bladder is
collapsed around Foley catheter. Bladder wall thickening and
pericystic fat stranding.

Stomach/Bowel: Stomach is within normal limits. Appendix appears
normal. No evidence of bowel wall thickening, distention, or
inflammatory changes. Sigmoid diverticulosis without findings of
acute diverticulitis.

Vascular/Lymphatic: Aortic atherosclerosis. No enlarged abdominal or
pelvic lymph nodes.

Reproductive: Mild prostate hypertrophy.

Other: Trace volume of pelvic ascites. Right inguinal hernia repair
without appreciable recurrence.

Musculoskeletal: No fracture is seen. Lumbar spondylosis greatest at
the L5-S1 level with there is moderate loss of intervertebral disc
space height.
IMPRESSION: 1. Bladder wall thickening, trace pelvic ascites, and pericystic fat
stranding, probably cystitis.
2. 2.7 cm indeterminate multi-septated cystic lesion arising from
lower pole of right kidney. Renal protocol CT or MRI is recommended
on a nonemergent basis.
3. Sigmoid diverticulosis without findings of acute diverticulitis.
4. Aortic Atherosclerosis ([id number]). Coronary artery calcific
atherosclerosis.
5. Small hiatal hernia.

By: Nydia Mandal M.D.
